# Patient Record
Sex: Female | Born: 1985 | Race: White | Hispanic: No | Smoking: Never smoker
Health system: Southern US, Community
[De-identification: ages and names within clinical notes are randomized; demographics above are authoritative.]

## PROBLEM LIST (undated history)

## (undated) ENCOUNTER — Inpatient Hospital Stay: Payer: Self-pay

## (undated) DIAGNOSIS — Z1589 Genetic susceptibility to other disease: Secondary | ICD-10-CM

## (undated) DIAGNOSIS — Z803 Family history of malignant neoplasm of breast: Secondary | ICD-10-CM

## (undated) DIAGNOSIS — Z9189 Other specified personal risk factors, not elsewhere classified: Secondary | ICD-10-CM

## (undated) HISTORY — DX: Genetic susceptibility to other disease: Z15.89

## (undated) HISTORY — DX: Other specified personal risk factors, not elsewhere classified: Z91.89

## (undated) HISTORY — DX: Family history of malignant neoplasm of breast: Z80.3

---
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
## 2005-09-09 ENCOUNTER — Emergency Department (HOSPITAL_COMMUNITY): Admission: EM | Admit: 2005-09-09 | Discharge: 2005-09-09 | Payer: Self-pay | Admitting: Emergency Medicine

## 2008-11-06 HISTORY — PX: ORIF ANKLE FRACTURE: SHX5408

## 2014-11-06 DIAGNOSIS — Z1589 Genetic susceptibility to other disease: Secondary | ICD-10-CM

## 2014-11-06 DIAGNOSIS — Z9189 Other specified personal risk factors, not elsewhere classified: Secondary | ICD-10-CM

## 2014-11-06 HISTORY — DX: Other specified personal risk factors, not elsewhere classified: Z91.89

## 2014-11-06 HISTORY — DX: Genetic susceptibility to other disease: Z15.89

## 2015-09-06 ENCOUNTER — Other Ambulatory Visit: Payer: Self-pay | Admitting: Obstetrics and Gynecology

## 2015-09-07 ENCOUNTER — Other Ambulatory Visit: Payer: Self-pay | Admitting: *Deleted

## 2015-09-07 ENCOUNTER — Other Ambulatory Visit: Payer: Self-pay | Admitting: Obstetrics and Gynecology

## 2015-09-07 ENCOUNTER — Inpatient Hospital Stay
Admission: RE | Admit: 2015-09-07 | Discharge: 2015-09-07 | Disposition: A | Discharge status: Home / Self Care | Payer: Self-pay | Source: Ambulatory Visit | Attending: *Deleted | Admitting: *Deleted

## 2015-09-07 DIAGNOSIS — Z9289 Personal history of other medical treatment: Secondary | ICD-10-CM

## 2015-09-07 DIAGNOSIS — R928 Other abnormal and inconclusive findings on diagnostic imaging of breast: Secondary | ICD-10-CM

## 2015-09-13 ENCOUNTER — Ambulatory Visit
Admission: RE | Admit: 2015-09-13 | Discharge: 2015-09-13 | Disposition: A | Discharge status: Home / Self Care | Payer: BLUE CROSS/BLUE SHIELD | Source: Ambulatory Visit | Attending: Obstetrics and Gynecology | Admitting: Obstetrics and Gynecology

## 2015-09-13 DIAGNOSIS — R928 Other abnormal and inconclusive findings on diagnostic imaging of breast: Secondary | ICD-10-CM | POA: Diagnosis present

## 2015-09-13 DIAGNOSIS — N63 Unspecified lump in breast: Secondary | ICD-10-CM | POA: Insufficient documentation

## 2015-09-22 ENCOUNTER — Ambulatory Visit: Payer: BLUE CROSS/BLUE SHIELD

## 2015-09-22 ENCOUNTER — Other Ambulatory Visit: Payer: BLUE CROSS/BLUE SHIELD

## 2015-11-07 NOTE — L&D Delivery Note (Signed)
Delivery Note At 6:11 AM a viable female was delivered via Vaginal, Spontaneous Delivery (Presentation:OA;LOA).  APGAR: 8, 9; weight 7 lb 13.6 oz (3560 g).   Placenta status: spontaneous,intact.  Cord:  with the following complications: none .  Cord pH: NA  Called to see patient.  Mom pushed to delivery viable female infant.  The head followed by shoulders, which delivered without difficulty, and the rest of the body.  No nuchal cord noted.  Baby to mom's chest.  Cord clamped and cut after > 5 min delay.  No cord blood obtained.  Placenta delivered spontaneously, intact, with a 3-vessel cord.  Second degree perineal laceration repaired with 3-0 Vicryl in standard fashion.  All counts correct.  Hemostasis obtained with IV pitocin and fundal massage. EBL 200 mL.    Anesthesia:  epidural Episiotomy: None Lacerations: 2nd degree Suture Repair: 3.0 vicryl Est. Blood Loss (mL):  Mom to postpartum.  Baby to Couplet care / Skin to Skin.  Tresea Mall, CNM

## 2016-01-30 ENCOUNTER — Encounter: Payer: Self-pay | Admitting: *Deleted

## 2016-01-30 ENCOUNTER — Observation Stay
Admission: EM | Admit: 2016-01-30 | Discharge: 2016-01-30 | Disposition: A | Discharge status: Home / Self Care | Payer: BLUE CROSS/BLUE SHIELD | Attending: Obstetrics and Gynecology | Admitting: Obstetrics and Gynecology

## 2016-01-30 DIAGNOSIS — Z3A20 20 weeks gestation of pregnancy: Secondary | ICD-10-CM | POA: Insufficient documentation

## 2016-01-30 DIAGNOSIS — O368120 Decreased fetal movements, second trimester, not applicable or unspecified: Principal | ICD-10-CM | POA: Insufficient documentation

## 2016-01-30 NOTE — OB Triage Note (Signed)
States no note of fetal movement since yesterday am. States she has noted fetal movement since 18-19 weeks, baby as reported to move  vigourously

## 2016-01-30 NOTE — OB Triage Note (Signed)
Pt instructed by Dr Jean RosenthalJackson to follow with next appointment and instructed to try Zantac 150 mg 1-2 daily as needed, may also try Prilosec 20 mg daily- with or without Zantac if needed. For her heartburn issues

## 2016-01-30 NOTE — Final Progress Note (Signed)
Physician Final Progress Note  Patient ID: Katelyn Russo MRN: [id number] DOB/AGE: 31/04/1986 30 y.o.  Admit date: 01/30/2016 Admitting provider: Conard NovakStephen D Arn Mcomber, MD Discharge date: 01/30/2016   Admission Diagnoses:  Supervision of pregnancy, second trimester 5658w5d gestation Decreased fetal movement  Discharge Diagnoses:  Supervision of pregnancy, second trimester 5858w5d gestation Decreased fetal movement - reassuring fetal heart rate with movement   History of Present Illness: The patient is a 30 y.o. female G3P1011 at 3358w5d who presents for decreased fetal movement. She has felt no fetal movement since yesterday at 2pm.  Denies ctx, lof, vb.   Past Medical History: No past medical history on file.  Past Surgical History  Procedure Laterality Date  . Rotator cuff repair Right 2004  . Orif ankle fracture Left 2010    No current facility-administered medications on file prior to encounter.   No current outpatient prescriptions on file prior to encounter.   Allergies: No Known Allergies  Social History   Social History  . Marital Status: Unknown    Spouse Name: N/A  . Number of Children: N/A  . Years of Education: N/A   Occupational History  . Not on file.   Social History Main Topics  . Smoking status: Not on file  . Smokeless tobacco: Not on file  . Alcohol Use: Not on file  . Drug Use: Not on file  . Sexual Activity: Not on file   Other Topics Concern  . Not on file   Social History Narrative    Physical Exam: BP 113/64 mmHg  Pulse 73  Temp(Src) 98.7 F (37.1 C) (Oral)  Resp 18  Ht 5\' 4"  (1.626 m)  Wt 62.285 kg (137 lb 5 oz)  BMI 23.56 kg/m2  SpO2 100%  LMP 09/12/2015  Gen: NAD CV: RRR Pulm: CTAB Pelvic: deferred Ext: no e/c/t  Consults: None  Significant Findings/ Diagnostic Studies:  Bedside ultrasound: single living intrauterine pregnancy with fetal movement. Fetal heart rate 155  Procedures: as above  Discharge Condition:  good  Disposition: Final discharge disposition not confirmed  Diet: Regular diet  Discharge Activity: Activity as tolerated     Medication List    TAKE these medications        ferrous sulfate 325 (65 FE) MG tablet  Take 325 mg by mouth daily with breakfast.     multivitamin tablet  Take 1 tablet by mouth daily.         Total time spent taking care of this patient: 25 minutes  Signed: Conard NovakJackson, Kirklin Mcduffee D, MD  01/30/2016, 8:23 PM

## 2016-01-30 NOTE — Progress Notes (Signed)
Pt left walking with Nursing Secretary to ER dept for d/c home, with belongings and d/c papers, (reviewed.)

## 2016-01-31 ENCOUNTER — Encounter (HOSPITAL_COMMUNITY): Payer: Self-pay

## 2016-02-29 ENCOUNTER — Encounter: Payer: Self-pay | Admitting: *Deleted

## 2016-02-29 ENCOUNTER — Observation Stay
Admission: EM | Admit: 2016-02-29 | Discharge: 2016-02-29 | Disposition: A | Discharge status: Home / Self Care | Payer: BLUE CROSS/BLUE SHIELD | Attending: Obstetrics and Gynecology | Admitting: Obstetrics and Gynecology

## 2016-02-29 ENCOUNTER — Observation Stay: Payer: BLUE CROSS/BLUE SHIELD

## 2016-02-29 DIAGNOSIS — K9289 Other specified diseases of the digestive system: Secondary | ICD-10-CM | POA: Diagnosis not present

## 2016-02-29 DIAGNOSIS — R1011 Right upper quadrant pain: Secondary | ICD-10-CM

## 2016-02-29 DIAGNOSIS — A084 Viral intestinal infection, unspecified: Secondary | ICD-10-CM | POA: Insufficient documentation

## 2016-02-29 DIAGNOSIS — O99282 Endocrine, nutritional and metabolic diseases complicating pregnancy, second trimester: Secondary | ICD-10-CM | POA: Diagnosis not present

## 2016-02-29 DIAGNOSIS — E860 Dehydration: Secondary | ICD-10-CM | POA: Diagnosis not present

## 2016-02-29 DIAGNOSIS — Z1501 Genetic susceptibility to malignant neoplasm of breast: Secondary | ICD-10-CM | POA: Insufficient documentation

## 2016-02-29 DIAGNOSIS — Z3A25 25 weeks gestation of pregnancy: Secondary | ICD-10-CM | POA: Insufficient documentation

## 2016-02-29 DIAGNOSIS — O99612 Diseases of the digestive system complicating pregnancy, second trimester: Secondary | ICD-10-CM | POA: Diagnosis not present

## 2016-02-29 DIAGNOSIS — M940 Chondrocostal junction syndrome [Tietze]: Secondary | ICD-10-CM | POA: Insufficient documentation

## 2016-02-29 DIAGNOSIS — O99512 Diseases of the respiratory system complicating pregnancy, second trimester: Secondary | ICD-10-CM | POA: Insufficient documentation

## 2016-02-29 DIAGNOSIS — Z1502 Genetic susceptibility to malignant neoplasm of ovary: Secondary | ICD-10-CM | POA: Diagnosis not present

## 2016-02-29 DIAGNOSIS — O9989 Other specified diseases and conditions complicating pregnancy, childbirth and the puerperium: Secondary | ICD-10-CM | POA: Diagnosis present

## 2016-02-29 LAB — COMPREHENSIVE METABOLIC PANEL
ALT: 12 U/L — AB (ref 14–54)
AST: 14 U/L — ABNORMAL LOW (ref 15–41)
Albumin: 3.7 g/dL (ref 3.5–5.0)
Alkaline Phosphatase: 40 U/L (ref 38–126)
Anion gap: 8 (ref 5–15)
BUN: 9 mg/dL (ref 6–20)
CHLORIDE: 105 mmol/L (ref 101–111)
CO2: 23 mmol/L (ref 22–32)
CREATININE: 0.5 mg/dL (ref 0.44–1.00)
Calcium: 9.1 mg/dL (ref 8.9–10.3)
GFR calc Af Amer: >60 mL/min (ref >60)
GFR calc non Af Amer: >60 mL/min (ref >60)
Glucose, Bld: 79 mg/dL (ref 65–99)
Potassium: 3.8 mmol/L (ref 3.5–5.1)
Sodium: 136 mmol/L (ref 135–145)
Total Bilirubin: 0.4 mg/dL (ref 0.3–1.2)
Total Protein: 6.5 g/dL (ref 6.5–8.1)

## 2016-02-29 LAB — CBC
HCT: 33.5 % — ABNORMAL LOW (ref 35.0–47.0)
Hemoglobin: 11.5 g/dL — ABNORMAL LOW (ref 12.0–16.0)
MCH: 31.1 pg (ref 26.0–34.0)
MCHC: 34.4 g/dL (ref 32.0–36.0)
MCV: 90.3 fL (ref 80.0–100.0)
Platelets: 348 10*3/uL (ref 150–440)
RBC: 3.71 MIL/uL — ABNORMAL LOW (ref 3.80–5.20)
RDW: 13.2 % (ref 11.5–14.5)
WBC: 9.5 10*3/uL (ref 3.6–11.0)

## 2016-02-29 LAB — LIPASE, BLOOD: Lipase: 25 U/L (ref 11–51)

## 2016-02-29 LAB — TROPONIN I: Troponin I: <0.03 ng/mL (ref <0.031)

## 2016-02-29 LAB — CKMB (ARMC ONLY): CK, MB: 0.8 ng/mL (ref 0.5–5.0)

## 2016-02-29 MED ORDER — ONDANSETRON HCL 4 MG/2ML IJ SOLN
4.0000 mg | Freq: Four times a day (QID) | INTRAMUSCULAR | Status: DC | PRN
  Administered 2016-02-29: 4 mg via INTRAVENOUS
  Filled 2016-02-29: qty 2

## 2016-02-29 MED ORDER — LACTATED RINGERS IV SOLN
INTRAVENOUS | Status: DC
  Administered 2016-02-29: 17:00:00 via INTRAVENOUS

## 2016-02-29 NOTE — H&P (Signed)
Obstetric H&P   Chief Complaint: Nausea, vomiting, chest pain  Prenatal Care Provider: [facility]  History of Present Illness: 30 y.o. G3P1011 40w0dby 06/13/2016, by LMP=8wk [place] L&[place] with 24-hrs of nausea, vomiting, loose stools.  Emesis improved but continued po intolerance.  No fevers, chills, or sick contacts.  Ate grilled cheese yesterday evening after which she had onset of symptoms.  She had similar symptoms and pain off and on since her last pregnancy and was told she possibly had gallstones.   She had associated epigastric and and back pain.  No diaphoresis, shortness of breath.  No known cardiac history, personal or family.    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   A pos / ABSC neg / RI / VZI / RPR NR / HIV neg / HBsAg neg   Review of Systems: 10 point review of systems negative unless otherwise noted in HPI  Past Medical History: No past medical history on file.  Past Surgical History: Past Surgical History  Procedure Laterality Date  . Rotator cuff repair Right 2004  . Orif ankle fracture Left 2010    Family History: Family History  Problem Relation Age of Onset  . Breast cancer Mother 463 . Breast cancer Maternal Grandmother 65    again at 767    Social History: Social History   Social History  . Marital Status: Unknown    Spouse Name: N/A  . Number of Children: N/A  . Years of Education: N/A   Occupational History  . Not on file.   Social History Main Topics  . Smoking status: Not on file  . Smokeless tobacco: Not on file  . Alcohol Use: Not on file  . Drug Use: Not on file  . Sexual Activity: Not on file   Other Topics Concern  . Not on file   Social History Narrative    Medications: Prior to Admission medications   Medication Sig Start Date End Date Taking? Authorizing Provider  ferrous sulfate 325 (65 FE) MG tablet Take 325 mg by mouth daily with breakfast.    Historical  Provider, MD  Multiple Vitamin (MULTIVITAMIN) tablet Take 1 tablet by mouth daily.    Historical Provider, MD    Allergies: No Known Allergies  Physical Exam: Vitals: Last menstrual period 09/12/2015.  Urine Dip Protein:  UA pending  FHT: 150, moderate variability, 10x10 accels, no decels Toco: none  General: NAD HEENT: normocephalic, anicteric Pulmonary: no increased work of breathing Cardiovascular: RRR Abdomen: Gravid,  Non-tender Genitourinary: deferred MSK: no CVA tenderness Extremities: no edema  Labs: No results found for this or any previous visit (from the past 24 hour(s)).  Assessment: 31y.o. G3P1011 256w0dy 06/13/2016,by LMP=8wk [place] with nausea, vomiting, and chest pain  Plan: 1) Nausea vomiting - will check CMP, CBC, lipase, and UA.  DDx includes gastroenteritis, biliary colic/cholelithiasis, and pancreatitis. - If consider RUQ ultrasound once labs reviewed   2) Fetus - cat I tracing  3)  Chest pain - doubt cardiac etiology but will check EKG and one set of cardiac enzymes  4) PNL A pos / ABSC neg / RI / VZI / RPR NR / HIV neg / HBsAg neg   5) Disposition - pending results of labs

## 2016-02-29 NOTE — Final Progress Note (Signed)
Physician Final Progress Note  Patient ID: Katelyn Russo MRN: [id number] DOB/AGE: 31/04/1986 31 y.o.  Admit date: 02/29/2016 Admitting provider: Vena AustriaAndreas Maha Fischel, MD Discharge date: 02/29/2016   Admission Diagnoses: Nausea, vomiting chest pain  Discharge Diagnoses:  Active Problems:   Dehydration Viral gastroenteritis, Tietze syndrome  Consults: None  Significant Findings/ Diagnostic Studies:  Results for orders placed or performed during the hospital encounter of 02/29/16 (from the past 24 hour(s))  CBC     Status: Abnormal   Collection Time: 02/29/16  3:58 PM  Result Value Ref Range   WBC 9.5 3.6 - 11.0 K/uL   RBC 3.71 (L) 3.80 - 5.20 MIL/uL   Hemoglobin 11.5 (L) 12.0 - 16.0 g/dL   HCT 82.933.5 (L) 56.235.0 - 13.047.0 %   MCV 90.3 80.0 - 100.0 fL   MCH 31.1 26.0 - 34.0 pg   MCHC 34.4 32.0 - 36.0 g/dL   RDW 86.513.2 78.411.5 - 69.614.5 %   Platelets 348 150 - 440 K/uL  Comprehensive metabolic panel     Status: Abnormal   Collection Time: 02/29/16  3:58 PM  Result Value Ref Range   Sodium 136 135 - 145 mmol/L   Potassium 3.8 3.5 - 5.1 mmol/L   Chloride 105 101 - 111 mmol/L   CO2 23 22 - 32 mmol/L   Glucose, Bld 79 65 - 99 mg/dL   BUN 9 6 - 20 mg/dL   Creatinine, Ser 2.950.50 0.44 - 1.00 mg/dL   Calcium 9.1 8.9 - 28.410.3 mg/dL   Total Protein 6.5 6.5 - 8.1 g/dL   Albumin 3.7 3.5 - 5.0 g/dL   AST 14 (L) 15 - 41 U/L   ALT 12 (L) 14 - 54 U/L   Alkaline Phosphatase 40 38 - 126 U/L   Total Bilirubin 0.4 0.3 - 1.2 mg/dL   GFR calc non Af Amer >60 >60 mL/min   GFR calc Af Amer >60 >60 mL/min   Anion gap 8 5 - 15  Lipase, blood     Status: None   Collection Time: 02/29/16  3:58 PM  Result Value Ref Range   Lipase 25 11 - 51 U/L  CKMB([facility] only)     Status: None   Collection Time: 02/29/16  3:58 PM  Result Value Ref Range   CK, MB 0.8 0.5 - 5.0 ng/mL  Troponin I     Status: None   Collection Time: 02/29/16  3:58 PM  Result Value Ref Range   Troponin I <0.03 <0.031 ng/mL   [place] Abdomen  Limited Ruq  02/29/2016  CLINICAL DATA:  Right upper quadrant pain EXAM: US ABDOMEN LIMITED - RIGHT UPPER QUADRANT COMPARISON:  None. FINDINGS: Gallbladder: Somewhat decompressed. No definitive gallstones are seen. There is suggestion of a 3 mm gallbladder polyp. No pericholecystic fluid is noted. Common bile duct: Diameter: 2.7 mm. Liver: No focal lesion identified. Within normal limits in parenchymal echogenicity. IMPRESSION: No acute abnormality noted. The gallbladder is predominately decompressed with a small gallbladder polyp. Electronically Signed   By: Alcide CleverMark  Lukens M.D.   On: 02/29/2016 18:18   Procedures:  1) Reactive NST 2) Normal EKG 3) Right upper quadrant ultrasound  Discharge Condition: good  Disposition: 01-Home or Self Care  Diet: Regular diet  Discharge Activity: Activity as tolerated     Medication List    TAKE these medications        ferrous sulfate 325 (65 FE) MG tablet  Take 325 mg by mouth daily with breakfast.  multivitamin tablet  Take 1 tablet by mouth daily.         Total time spent taking care of this patient: 45 minutes  Signed: Lorrene Reid 02/29/2016, 8:19 PM

## 2016-02-29 NOTE — Discharge Instructions (Signed)
Please get plenty of rest and water.  Please call or return if symptoms worsen. Discharge instructions given. Patient stated understanding.

## 2016-05-27 ENCOUNTER — Inpatient Hospital Stay: Payer: BLUE CROSS/BLUE SHIELD | Admitting: Anesthesiology

## 2016-05-27 ENCOUNTER — Encounter: Payer: Self-pay | Admitting: *Deleted

## 2016-05-27 ENCOUNTER — Inpatient Hospital Stay
Admission: EM | Admit: 2016-05-27 | Discharge: 2016-05-29 | DRG: 775 | Disposition: A | Discharge status: Home / Self Care | Payer: BLUE CROSS/BLUE SHIELD | Attending: Obstetrics & Gynecology | Admitting: Obstetrics & Gynecology

## 2016-05-27 DIAGNOSIS — Z9889 Other specified postprocedural states: Secondary | ICD-10-CM | POA: Diagnosis not present

## 2016-05-27 DIAGNOSIS — Z803 Family history of malignant neoplasm of breast: Secondary | ICD-10-CM

## 2016-05-27 DIAGNOSIS — Z79899 Other long term (current) drug therapy: Secondary | ICD-10-CM

## 2016-05-27 DIAGNOSIS — Z3A37 37 weeks gestation of pregnancy: Secondary | ICD-10-CM | POA: Diagnosis not present

## 2016-05-27 DIAGNOSIS — Z885 Allergy status to narcotic agent status: Secondary | ICD-10-CM | POA: Diagnosis not present

## 2016-05-27 LAB — CBC
HEMATOCRIT: 35 % (ref 35.0–47.0)
HEMOGLOBIN: 12.3 g/dL (ref 12.0–16.0)
MCH: 31.2 pg (ref 26.0–34.0)
MCHC: 35.1 g/dL (ref 32.0–36.0)
MCV: 88.9 fL (ref 80.0–100.0)
Platelets: 287 10*3/uL (ref 150–440)
RBC: 3.94 MIL/uL (ref 3.80–5.20)
RDW: 13.2 % (ref 11.5–14.5)
WBC: 11.1 10*3/uL — AB (ref 3.6–11.0)

## 2016-05-27 LAB — TYPE AND SCREEN
ABO/RH(D): A POS
ANTIBODY SCREEN: NEGATIVE

## 2016-05-27 MED ORDER — LIDOCAINE HCL (PF) 1 % IJ SOLN: 30.0000 mL | INTRAMUSCULAR | Status: DC | PRN

## 2016-05-27 MED ORDER — LACTATED RINGERS IV SOLN
INTRAVENOUS | Status: DC
  Administered 2016-05-27: 21:00:00 via INTRAVENOUS

## 2016-05-27 MED ORDER — BUTORPHANOL TARTRATE 1 MG/ML IJ SOLN: 1.0000 mg | INTRAMUSCULAR | Status: DC | PRN

## 2016-05-27 MED ORDER — OXYTOCIN 40 UNITS IN LACTATED RINGERS INFUSION - SIMPLE MED
2.5000 [IU]/h | INTRAVENOUS | Status: DC
  Administered 2016-05-28: 39.96 [IU]/h via INTRAVENOUS
  Filled 2016-05-27: qty 1000

## 2016-05-27 MED ORDER — OXYTOCIN BOLUS FROM INFUSION: 500.0000 mL | INTRAVENOUS | Status: DC

## 2016-05-27 MED ORDER — FENTANYL 2.5 MCG/ML W/ROPIVACAINE 0.2% IN NS 100 ML EPIDURAL INFUSION (ARMC-ANES)
EPIDURAL | Status: AC
  Administered 2016-05-27: 10 mL/h via EPIDURAL
  Filled 2016-05-27: qty 100

## 2016-05-27 MED ORDER — AMMONIA AROMATIC IN INHA
RESPIRATORY_TRACT | Status: AC
  Filled 2016-05-27: qty 10

## 2016-05-27 MED ORDER — OXYTOCIN 10 UNIT/ML IJ SOLN
INTRAMUSCULAR | Status: AC
  Filled 2016-05-27: qty 2

## 2016-05-27 MED ORDER — BUPIVACAINE HCL (PF) 0.25 % IJ SOLN
INTRAMUSCULAR | Status: DC | PRN
  Administered 2016-05-27: 3 mL via EPIDURAL

## 2016-05-27 MED ORDER — ACETAMINOPHEN 325 MG PO TABS: 650.0000 mg | ORAL_TABLET | ORAL | Status: DC | PRN

## 2016-05-27 MED ORDER — MISOPROSTOL 200 MCG PO TABS
ORAL_TABLET | ORAL | Status: AC
  Filled 2016-05-27: qty 4

## 2016-05-27 MED ORDER — LIDOCAINE HCL (PF) 1 % IJ SOLN
INTRAMUSCULAR | Status: AC
  Filled 2016-05-27: qty 30

## 2016-05-27 MED ORDER — ONDANSETRON HCL 4 MG/2ML IJ SOLN
4.0000 mg | Freq: Four times a day (QID) | INTRAMUSCULAR | Status: DC | PRN
  Administered 2016-05-28: 4 mg via INTRAVENOUS
  Filled 2016-05-27: qty 2

## 2016-05-27 MED ORDER — LACTATED RINGERS IV SOLN: 500.0000 mL | INTRAVENOUS | Status: DC | PRN

## 2016-05-27 NOTE — OB Triage Note (Signed)
Pt complains of contractions that started around3pm today.  Pt rates contractions 7-8/10. Diarrhea reported x2 over last two days.

## 2016-05-27 NOTE — Anesthesia Procedure Notes (Signed)
Epidural Patient location during procedure: OB  Staffing Anesthesiologist: Berdine Addison Performed by: anesthesiologist   Preanesthetic Checklist Completed: patient identified, site marked, surgical consent, pre-op evaluation, timeout performed, IV checked, risks and benefits discussed and monitors and equipment checked  Epidural Patient position: sitting Prep: Betadine Patient monitoring: heart rate, continuous pulse ox and blood pressure Approach: midline Location: L4-L5 Injection technique: LOR saline  Needle:  Needle type: Tuohy  Needle gauge: 18 G Needle length: 9 cm and 9 Catheter type: closed end flexible Catheter size: 20 Guage Test dose: negative and 1.5% lidocaine with Epi 1:200 K  Assessment Sensory level: T10 Events: blood not aspirated, injection not painful, no injection resistance, negative IV test and no paresthesia  Additional Notes   Patient tolerated the insertion well without complications. 2145 start. 2153 catheter. 2155 test. 2200 bolus 3 ml. 2204 Infusion.Reason for block:procedure for pain

## 2016-05-27 NOTE — Anesthesia Preprocedure Evaluation (Signed)
Anesthesia Evaluation  Patient identified by MRN, date of birth, ID band Patient awake    Reviewed: Allergy & Precautions, NPO status , Patient's Chart, lab work & pertinent test results, reviewed documented beta blocker date and time   Airway Mallampati: II  TM Distance: >3 FB     Dental  (+) Chipped   Pulmonary           Cardiovascular      Neuro/Psych    GI/Hepatic   Endo/Other    Renal/GU      Musculoskeletal   Abdominal   Peds  Hematology   Anesthesia Other Findings   Reproductive/Obstetrics                             Anesthesia Physical Anesthesia Plan  ASA: II  Anesthesia Plan: Epidural   Post-op Pain Management:    Induction:   Airway Management Planned:   Additional Equipment:   Intra-op Plan:   Post-operative Plan:   Informed Consent: I have reviewed the patients History and Physical, chart, labs and discussed the procedure including the risks, benefits and alternatives for the proposed anesthesia with the patient or authorized representative who has indicated his/her understanding and acceptance.     Plan Discussed with: CRNA  Anesthesia Plan Comments:         Anesthesia Quick Evaluation  

## 2016-05-27 NOTE — H&P (Signed)
OB History & Physical   History of Present Illness:  Chief Complaint: Contractions  Date of admission: 05/27/2016  HPI:  Katelyn Russo is a 30 y.o. G12P1011 female at [redacted]w[redacted]d per prenatal records dated by LMP agrees with 8wk6dU/S 1/3. She has an EDD of 8/13 per prenatal records. In Epic her due date is entered as 26/8 and a gestational age as [redacted]w[redacted]d on admission 7/22.  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    She reports contractions.   She denies leakage of fluid.   She denies vaginal bleeding.   She reports fetal movement.    Maternal Medical History:  History reviewed. No pertinent past medical history.  Past Surgical History  Procedure Laterality Date  . Rotator cuff repair Right 2004  . Orif ankle fracture Left 2010    Allergies  Allergen Reactions  . Morphine And Related Other (See Comments)    "felt like my body was on fire"    Prior to Admission medications   Medication Sig Start Date End Date Taking? Authorizing Provider  Multiple Vitamin (MULTIVITAMIN) tablet Take 1 tablet by mouth daily.   Yes Historical Provider, MD  ferrous sulfate 325 (65 FE) MG tablet Take 325 mg by mouth daily with breakfast. Reported on 05/27/2016    Historical Provider, MD  omeprazole (PRILOSEC) 20 MG capsule Take 20 mg by mouth daily.    Historical Provider, MD    OB History  Gravida Para Term Preterm AB SAB TAB Ectopic Multiple Living  # Outcome Date GA Lbr Len/2nd Weight Sex Delivery Anes PTL Lv  3 Current           2 SAB           1 Term               Prenatal care site: [facility]  Social History: She  reports that she has never smoked. She has never used smokeless tobacco. She reports that she does not drink alcohol or use illicit drugs.  Family History: family history includes Breast cancer (age of onset: 2) in her mother; Breast cancer (age of onset: 25) in  her maternal grandmother.   Review of Systems: Negative x 10 systems reviewed except as noted in the HPI.    Physical Exam:  Vital Signs: BP 100/63 mmHg  Pulse 73  LMP 09/12/2015 General: no acute distress.  HEENT: normocephalic, atraumatic Heart: regular rate & rhythm.  No murmurs/rubs/gallops Lungs: clear to auscultation bilaterally Abdomen: soft, gravid, non-tender;  EFW: 8 pounds Pelvic: (female chaperone present during pelvic exam)  External: Normal external female genitalia  Cervix: Dilation: 5.5 / Effacement (%): 80 / Station: -1   Extremities: non-tender, symmetric, no edema bilaterally.  DTRs: 2+  Neurologic: Alert & oriented x 3.    Pertinent Results:  Prenatal Labs: Blood type/Rh A positive  Antibody screen negative  Rubella Immune  Varicella Immune    RPR negative  HBsAg negative  HIV negative  GC negative  Chlamydia negative  Genetic screening Not done  1 hour GTT 123 on 5/22  3 hour GTT NA  GBS Unknown, specimen collected 7/21   Baseline FHR: 140 beats/min   Variability: moderate   Accelerations: present   Decelerations: absent Contractions: present frequency: 2-5 min Overall assessment: Category I tracing   Assessment:  Katelyn Russo is a 30 y.o. Q6V7846  female at [redacted]w[redacted]d with labor contractions.   Plan:  1. Admit to Labor & Delivery  2. CBC, T&S, Clrs, IVF 3. GBS unknown: membranes intact, no risk factors, no treatment per CDC guidelines.   4. Fetal well-being: Category I 5. Epidural as desired  Emalea Mix, CNM

## 2016-05-28 MED ORDER — DIPHENHYDRAMINE HCL 25 MG PO CAPS: 25.0000 mg | ORAL_CAPSULE | Freq: Four times a day (QID) | ORAL | Status: DC | PRN

## 2016-05-28 MED ORDER — SIMETHICONE 80 MG PO CHEW: 80.0000 mg | CHEWABLE_TABLET | ORAL | Status: DC | PRN

## 2016-05-28 MED ORDER — COCONUT OIL OIL
1.0000 "application " | TOPICAL_OIL | Status: DC | PRN
  Administered 2016-05-28: 1 via TOPICAL
  Filled 2016-05-28: qty 120

## 2016-05-28 MED ORDER — WITCH HAZEL-GLYCERIN EX PADS: 1.0000 "application " | MEDICATED_PAD | CUTANEOUS | Status: DC | PRN

## 2016-05-28 MED ORDER — BENZOCAINE-MENTHOL 20-0.5 % EX AERO
1.0000 "application " | INHALATION_SPRAY | CUTANEOUS | Status: DC | PRN
  Filled 2016-05-28: qty 56

## 2016-05-28 MED ORDER — FENTANYL 2.5 MCG/ML W/ROPIVACAINE 0.2% IN NS 100 ML EPIDURAL INFUSION (ARMC-ANES): 10.0000 mL/h | EPIDURAL | Status: DC

## 2016-05-28 MED ORDER — SENNOSIDES-DOCUSATE SODIUM 8.6-50 MG PO TABS
2.0000 | ORAL_TABLET | ORAL | Status: DC
  Administered 2016-05-28: 2 via ORAL
  Filled 2016-05-28: qty 2

## 2016-05-28 MED ORDER — EPHEDRINE 5 MG/ML INJ: 10.0000 mg | INTRAVENOUS | Status: DC | PRN

## 2016-05-28 MED ORDER — PRENATAL MULTIVITAMIN CH: 1.0000 | ORAL_TABLET | Freq: Every day | ORAL | Status: DC

## 2016-05-28 MED ORDER — SODIUM CHLORIDE FLUSH 0.9 % IV SOLN
INTRAVENOUS | Status: AC
  Filled 2016-05-28: qty 10

## 2016-05-28 MED ORDER — FENTANYL CITRATE (PF) 100 MCG/2ML IJ SOLN
INTRAMUSCULAR | Status: AC
  Administered 2016-05-28: 50 ug via INTRAVENOUS
  Filled 2016-05-28: qty 2

## 2016-05-28 MED ORDER — ONDANSETRON HCL 4 MG PO TABS
4.0000 mg | ORAL_TABLET | ORAL | Status: DC | PRN
  Administered 2016-05-29: 4 mg via ORAL
  Filled 2016-05-28: qty 1

## 2016-05-28 MED ORDER — PHENYLEPHRINE 40 MCG/ML (10ML) SYRINGE FOR IV PUSH (FOR BLOOD PRESSURE SUPPORT): 80.0000 ug | PREFILLED_SYRINGE | INTRAVENOUS | Status: DC | PRN

## 2016-05-28 MED ORDER — SENNOSIDES-DOCUSATE SODIUM 8.6-50 MG PO TABS: 2.0000 | ORAL_TABLET | ORAL | Status: DC

## 2016-05-28 MED ORDER — OXYCODONE-ACETAMINOPHEN 5-325 MG PO TABS
1.0000 | ORAL_TABLET | ORAL | Status: DC | PRN
  Administered 2016-05-28 – 2016-05-29 (×2): 1 via ORAL
  Filled 2016-05-28: qty 2
  Filled 2016-05-28: qty 1

## 2016-05-28 MED ORDER — LACTATED RINGERS IV SOLN: 500.0000 mL | Freq: Once | INTRAVENOUS | Status: DC

## 2016-05-28 MED ORDER — ACETAMINOPHEN 325 MG PO TABS: 650.0000 mg | ORAL_TABLET | ORAL | Status: DC | PRN

## 2016-05-28 MED ORDER — DIBUCAINE 1 % RE OINT: 1.0000 "application " | TOPICAL_OINTMENT | RECTAL | Status: DC | PRN

## 2016-05-28 MED ORDER — IBUPROFEN 600 MG PO TABS
600.0000 mg | ORAL_TABLET | Freq: Four times a day (QID) | ORAL | Status: DC
  Administered 2016-05-28 – 2016-05-29 (×4): 600 mg via ORAL
  Filled 2016-05-28 (×4): qty 1

## 2016-05-28 MED ORDER — FENTANYL CITRATE (PF) 100 MCG/2ML IJ SOLN
50.0000 ug | Freq: Once | INTRAMUSCULAR | Status: AC
  Administered 2016-05-28: 50 ug via INTRAVENOUS

## 2016-05-28 MED ORDER — EPHEDRINE 5 MG/ML INJ
10.0000 mg | INTRAVENOUS | Status: DC | PRN
Start: 2016-05-28 — End: 2016-05-28

## 2016-05-28 MED ORDER — ONDANSETRON HCL 4 MG/2ML IJ SOLN: 4.0000 mg | INTRAMUSCULAR | Status: DC | PRN

## 2016-05-28 NOTE — Discharge Summary (Signed)
Obstetric Discharge Summary   Reason for Admission: onset of labor Prenatal Procedures: ultrasound Intrapartum Procedures: spontaneous vaginal delivery, 05/28/16 Postpartum Procedures: none Complications-Operative and Postpartum: 2nd degree perineal laceration   Hemoglobin  Date Value Ref Range Status  05/29/2016 9.7 (L) 12.0 - 16.0 g/dL Final   HCT  Date Value Ref Range Status  05/29/2016 28.0 (L) 35.0 - 47.0 % Final   A+, Rubella Immune, Varicella Immune, TDAP UTD Breastfeeding/Contraception: Breastfeeding, undecided re: contraception  Patient is ambulating and voiding without difficulty. She is tolerating PO intake and her pain is well controlled with PO pain meds.  Physical Exam:  BP 97/63 (BP Location: Left Arm)   Pulse 78   Temp 98.4 F (36.9 C) (Oral)   Resp 18   LMP 09/12/2015   SpO2 100%   Breastfeeding? Unknown   General: alert, cooperative, appears stated age and no distress Lochia: appropriate Uterine Fundus: firm Incision: NA DVT Evaluation: No evidence of DVT seen on physical exam.  Follow-up Information    GLEDHILL,JANE, CNM. Call in 6 week(s).   Specialty:  Obstetrics Contact information: [address] [place] [address] [phone]            Medication List    TAKE these medications   ibuprofen 600 MG tablet Commonly known as:  ADVIL,MOTRIN Take 1 tablet (600 mg total) by mouth every 6 (six) hours.   multivitamin tablet Take 1 tablet by mouth daily.   omeprazole 20 MG capsule Commonly known as:  PRILOSEC Take 20 mg by mouth daily.     ASK your doctor about these medications   ferrous sulfate 325 (65 FE) MG tablet Take 325 mg by mouth daily with breakfast. Reported on 05/27/2016       Discharge Diagnoses: Term Pregnancy-delivered  Discharge Information: Date: 05/29/2016 Activity: pelvic rest Diet: routine Condition: stable Instructions: Postpartum care for vaginal delivery instructions reviewed with patient. Written  materials given to patient. Discharge to: home  Newborn Data: Live born female Sharlet [place] Birth Weight: 7 lb 13.6 oz (3560 g) APGAR: 8, 9  Home with mother.  Conard Novak, MD 05/29/2016, 10:41 AM

## 2016-05-29 ENCOUNTER — Encounter: Payer: Self-pay | Admitting: Anesthesiology

## 2016-05-29 LAB — RPR: RPR Ser Ql: NONREACTIVE

## 2016-05-29 LAB — CBC
HEMATOCRIT: 28 % — AB (ref 35.0–47.0)
HEMOGLOBIN: 9.7 g/dL — AB (ref 12.0–16.0)
MCH: 31.1 pg (ref 26.0–34.0)
MCHC: 34.6 g/dL (ref 32.0–36.0)
MCV: 90 fL (ref 80.0–100.0)
Platelets: 231 10*3/uL (ref 150–440)
RBC: 3.12 MIL/uL — ABNORMAL LOW (ref 3.80–5.20)
RDW: 13.1 % (ref 11.5–14.5)
WBC: 12.4 10*3/uL — AB (ref 3.6–11.0)

## 2016-05-29 MED ORDER — IBUPROFEN 600 MG PO TABS: 600.0000 mg | ORAL_TABLET | Freq: Four times a day (QID) | ORAL | 0 refills | Status: DC

## 2016-05-29 NOTE — Discharge Instructions (Signed)
Follow up sooner with fever, problems breathing, pain not helped by medications, severe depression( more than just baby blues, wanting to hurt yourself or the baby), severe bleeding ( saturating more than one pad an hour or large palm sized clots), no heavy lifting , no driving while taking narcotics, no douches, intercourse, tampons or enemas for 6 weeks  °

## 2016-05-29 NOTE — Progress Notes (Signed)
All discharge instructions given to patient and she voices understanding of all instructions given. She will make her own f/u appt for 6 wks. Prescription given.  Patient discharged home with spouse and infant escorted out by auxillary .

## 2016-05-29 NOTE — Addendum Note (Signed)
`<  DIV class=debugReport><DIV class=debugTitle>Report [id number] - AN AUDIT TRAIL REPORT</DIV></DIV>`<DIV class=debugTitle>Print Group [id number] - An Audit Trail With Extended Information</DIV>Addendum  created 05/29/16 1030 by Fabienne Bruns, RN   Anesthesia Procedure Navigator section edited

## 2016-06-06 ENCOUNTER — Encounter: Payer: Self-pay | Admitting: Anesthesiology

## 2017-01-03 ENCOUNTER — Other Ambulatory Visit: Payer: Self-pay | Admitting: Obstetrics and Gynecology

## 2017-01-03 DIAGNOSIS — Z1231 Encounter for screening mammogram for malignant neoplasm of breast: Secondary | ICD-10-CM

## 2017-01-08 ENCOUNTER — Encounter: Payer: Self-pay | Admitting: Radiology

## 2017-01-08 ENCOUNTER — Ambulatory Visit
Admission: RE | Admit: 2017-01-08 | Discharge: 2017-01-08 | Disposition: A | Discharge status: Home / Self Care | Payer: BLUE CROSS/BLUE SHIELD | Source: Ambulatory Visit | Attending: Obstetrics and Gynecology | Admitting: Obstetrics and Gynecology

## 2017-01-08 ENCOUNTER — Telehealth: Payer: Self-pay

## 2017-01-08 DIAGNOSIS — Z1231 Encounter for screening mammogram for malignant neoplasm of breast: Secondary | ICD-10-CM

## 2017-01-08 DIAGNOSIS — Z803 Family history of malignant neoplasm of breast: Secondary | ICD-10-CM | POA: Diagnosis not present

## 2017-01-08 NOTE — Telephone Encounter (Signed)
CLG patient

## 2017-01-08 NOTE — Telephone Encounter (Signed)
Pt calling to see if we recv'd [place].  Pt would like to get results today. [phone]

## 2017-01-09 ENCOUNTER — Other Ambulatory Visit: Payer: Self-pay | Admitting: Obstetrics and Gynecology

## 2017-01-09 DIAGNOSIS — Z9189 Other specified personal risk factors, not elsewhere classified: Secondary | ICD-10-CM

## 2017-01-09 DIAGNOSIS — Z1589 Genetic susceptibility to other disease: Secondary | ICD-10-CM

## 2017-01-09 NOTE — Telephone Encounter (Signed)
Report received and sent to [facility]. Please review and contact pt with results. Thank you.

## 2017-01-09 NOTE — Telephone Encounter (Signed)
Advised pt CLG would contact once we received results. Pt aware CLG out of the office until Thurs 3/8.

## 2017-01-09 NOTE — Telephone Encounter (Signed)
3/6 Left message that mammogram was normal. Katelyn Russo

## 2017-01-19 ENCOUNTER — Ambulatory Visit: Payer: BLUE CROSS/BLUE SHIELD | Admitting: Oncology

## 2017-02-04 DIAGNOSIS — Z1379 Encounter for other screening for genetic and chromosomal anomalies: Secondary | ICD-10-CM | POA: Insufficient documentation

## 2017-02-04 NOTE — Progress Notes (Deleted)
[name]  Telephone:([phone] Fax:(336) [phone]  ID: Katelyn Russo OB: 08/09/1986  MR#: [id number]  AOZ#:[id number]  Patient Care Team: No Pcp Per Patient as PCP - General (General Practice)  CHIEF COMPLAINT: Genetic testing.  INTERVAL HISTORY: ***  REVIEW OF SYSTEMS:   ROS  As per HPI. Otherwise, a complete review of systems is negative.  PAST MEDICAL HISTORY: No past medical history on file.  PAST SURGICAL HISTORY: Past Surgical History:  Procedure Laterality Date  . ORIF ANKLE FRACTURE Left 2010  . ROTATOR CUFF REPAIR Right 2004    FAMILY HISTORY: Family History  Problem Relation Age of Onset  . Breast cancer Mother 54  . Breast cancer Maternal Grandmother 44    again at 53     ADVANCED DIRECTIVES (Y/N):  N  HEALTH MAINTENANCE: Social History  Substance Use Topics  . Smoking status: Never Smoker  . Smokeless tobacco: Never Used  . Alcohol use No     Colonoscopy:  PAP:  Bone density:  Lipid panel:  Allergies  Allergen Reactions  . Morphine And Related Other (See Comments)    "felt like my body was on fire"    Current Outpatient Prescriptions  Medication Sig Dispense Refill  . ibuprofen (ADVIL,MOTRIN) 600 MG tablet Take 1 tablet (600 mg total) by mouth every 6 (six) hours. 30 tablet 0  . Multiple Vitamin (MULTIVITAMIN) tablet Take 1 tablet by mouth daily.    Marland Kitchen omeprazole (PRILOSEC) 20 MG capsule Take 20 mg by mouth daily.     No current facility-administered medications for this visit.     OBJECTIVE: There were no vitals filed for this visit.   There is no height or weight on file to calculate BMI.    ECOG FS:{CHL ONC [id number]  General: Well-developed, well-nourished, no acute distress. Eyes: Pink conjunctiva, anicteric sclera. HEENT: Normocephalic, moist mucous membranes, clear oropharnyx. Lungs: Clear to auscultation bilaterally. Heart: Regular rate and rhythm. No rubs, murmurs, or gallops. Abdomen: Soft,  nontender, nondistended. No organomegaly noted, normoactive bowel sounds. Musculoskeletal: No edema, cyanosis, or clubbing. Neuro: Alert, answering all questions appropriately. Cranial nerves grossly intact. Skin: No rashes or petechiae noted. Psych: Normal affect. Lymphatics: No cervical, calvicular, axillary or inguinal LAD.   LAB RESULTS:  Lab Results  Component Value Date   NA 136 02/29/2016   K 3.8 02/29/2016   CL 105 02/29/2016   CO2 23 02/29/2016   GLUCOSE 79 02/29/2016   BUN 9 02/29/2016   CREATININE 0.50 02/29/2016   CALCIUM 9.1 02/29/2016   PROT 6.5 02/29/2016   ALBUMIN 3.7 02/29/2016   AST 14 (L) 02/29/2016   ALT 12 (L) 02/29/2016   ALKPHOS 40 02/29/2016   BILITOT 0.4 02/29/2016   GFRNONAA >60 02/29/2016   GFRAA >60 02/29/2016    Lab Results  Component Value Date   WBC 12.4 (H) 05/29/2016   HGB 9.7 (L) 05/29/2016   HCT 28.0 (L) 05/29/2016   MCV 90.0 05/29/2016   PLT 231 05/29/2016     STUDIES: Mm Screening Breast Tomo Bilateral  Result Date: 01/08/2017 CLINICAL DATA:  Screening. Strong family history of breast cancer. The patient's mother was diagnosed with breast cancer at the age of 98. EXAM: 2D DIGITAL SCREENING BILATERAL MAMMOGRAM WITH CAD AND ADJUNCT TOMO COMPARISON:  None. ACR Breast Density Category d: The breast tissue is extremely dense, which lowers the sensitivity of mammography. FINDINGS: There are no findings suspicious for malignancy. Images were processed with CAD. IMPRESSION: No mammographic evidence of malignancy. A  result letter of this screening mammogram will be mailed directly to the patient. RECOMMENDATION: Screening mammogram in 1 year is recommended. Patient has a strong family history of breast cancer with her mother being diagnosed at the age of 23. BI-RADS CATEGORY  1: Negative. Electronically Signed   By: Baird Lyons M.D.   On: 01/08/2017 12:45    ASSESSMENT: Genetic testing  PLAN:    1. Genetic testing:  Patient expressed  understanding and was in agreement with this plan. She also understands that She can call clinic at any time with any questions, concerns, or complaints.   Cancer Staging No matching staging information was found for the patient.  Katelyn Ruths, MD   02/04/2017 10:42 PM

## 2017-02-05 ENCOUNTER — Inpatient Hospital Stay: Payer: BLUE CROSS/BLUE SHIELD | Admitting: Oncology

## 2017-04-20 ENCOUNTER — Encounter: Payer: Self-pay | Admitting: Obstetrics and Gynecology

## 2017-11-21 ENCOUNTER — Encounter: Payer: Self-pay | Admitting: Obstetrics and Gynecology

## 2017-11-21 ENCOUNTER — Telehealth: Payer: Self-pay

## 2017-11-21 ENCOUNTER — Ambulatory Visit (INDEPENDENT_AMBULATORY_CARE_PROVIDER_SITE_OTHER): Payer: BLUE CROSS/BLUE SHIELD | Admitting: Obstetrics and Gynecology

## 2017-11-21 VITALS — BP 116/64 | Wt 133.0 lb

## 2017-11-21 DIAGNOSIS — Z9189 Other specified personal risk factors, not elsewhere classified: Secondary | ICD-10-CM | POA: Diagnosis not present

## 2017-11-21 DIAGNOSIS — F329 Major depressive disorder, single episode, unspecified: Secondary | ICD-10-CM | POA: Diagnosis not present

## 2017-11-21 DIAGNOSIS — Z1589 Genetic susceptibility to other disease: Secondary | ICD-10-CM | POA: Diagnosis not present

## 2017-11-21 DIAGNOSIS — F32A Depression, unspecified: Secondary | ICD-10-CM

## 2017-11-21 DIAGNOSIS — N938 Other specified abnormal uterine and vaginal bleeding: Secondary | ICD-10-CM | POA: Diagnosis not present

## 2017-11-21 DIAGNOSIS — F419 Anxiety disorder, unspecified: Secondary | ICD-10-CM | POA: Diagnosis not present

## 2017-11-21 DIAGNOSIS — Z13220 Encounter for screening for lipoid disorders: Secondary | ICD-10-CM

## 2017-11-21 LAB — POCT URINE PREGNANCY: Preg Test, Ur: NEGATIVE

## 2017-11-21 NOTE — Telephone Encounter (Signed)
Pt is scheduled w/[facility] for AE 12/04/17. She is experiencing heavy bleeding w/clots & 7 day menstrual cycle & 3 days later it came back just as heavy if not more heavy w/clots. She has had low iron for a while. She is uncertain why this is happening but it is bothersome to her since she has genetic marker for ovarian cancer. Pt requests apt to be moved up & would like to discuss w/[facility] also. Cb#[phone]  Spoke w/Sara P who has moved pt's apt to 11/28/17 @11am .

## 2017-11-21 NOTE — Telephone Encounter (Signed)
Pt states she is soaking through 1 tampon per hour, wants to have a problem visit before the 1/23 visit. [facility] KJ CMA

## 2017-11-21 NOTE — Telephone Encounter (Signed)
Is pt on BC? Take UPT. F/u at 11/28/17 appt. If sx too bad to wait till that long, needs to come in for problem visit/eval sooner. RN to notify pt.

## 2017-11-21 NOTE — Patient Instructions (Signed)
I value your feedback and entrusting us with your care. If you get a [facility] patient survey, I would appreciate you taking the time to let us know about your experience today. Thank you! 

## 2017-11-21 NOTE — Progress Notes (Signed)
Chief Complaint  Patient presents with  . Menometrorrhagia    HPI:      Katelyn Russo is a 32 y.o. [id number] who LMP was Patient's last menstrual period was 11/10/2017 (exact date)., presents today for DUB sx this month. Menses are usually monthly, last 7 days, med flow, changing tampons Q3 mo with small clots and mild dysmen, no BTB. Pt had normal menses 11/10/17 that lasted 7 days. She then started bleeding again 11/19/17 that was heavier than usual flow and with large clots. No dysmen. Bleeding better today. Pt very concerned since never happened before. She is under increased stress recently.   She is sex active, no new partners. Using condoms usually. Concerned about OCPs due to [place] breast cancer and pt's increased risk of breast cancer.   Pt also concerned about RAD51C mutation. She did OCPs in the past but is concerned again due to [place] breast cancer. She is considering TL. She has not had recent GYN u/s/ca-125.   She also notes some depression/sadness sx. She had them PP and they haven't resolved. She tried xanax once which caused severe fatigue. She also tried zoloft which caused OCD tendencies, although helped with sx.  She is due for annual and has sched in a couple wks.    Past Medical History:  Diagnosis Date  . Family history of breast cancer   . 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 Increased risk of breast cancer 2016   IBIS=30% due to [place], not RAD51 C mutation    Past Surgical History:  Procedure Laterality Date  . ORIF ANKLE FRACTURE Left 2010  . ROTATOR CUFF REPAIR Right 2004    Family History  Problem Relation Age of Onset  . Breast cancer Mother 75  . Breast cancer Maternal Grandmother 65       again at 34     Social History   Socioeconomic History  . Marital status: Unknown    Spouse name: Not on file  . Number of children: Not on file  . Years of education: Not on file  . Highest education  level: Not on file  Social Needs  . Financial resource strain: Not on file  . Food insecurity - worry: Not on file  . Food insecurity - inability: Not on file  . Transportation needs - medical: Not on file  . Transportation needs - non-medical: Not on file  Occupational History  . Not on file  Tobacco Use  . Smoking status: Never Smoker  . Smokeless tobacco: Never Used  Substance and Sexual Activity  . Alcohol use: No  . Drug use: No  . Sexual activity: Yes  Other Topics Concern  . Not on file  Social History Narrative  . Not on file    No current outpatient medications on file.   ROS:  Review of Systems  Constitutional: Negative for fever.  Gastrointestinal: Negative for blood in stool, constipation, diarrhea, nausea and vomiting.  Genitourinary: Positive for menstrual problem. Negative for dyspareunia, dysuria, flank pain, frequency, hematuria, urgency, vaginal bleeding, vaginal discharge and vaginal pain.  Musculoskeletal: Negative for back pain.  Skin: Negative for rash.  Psychiatric/Behavioral: Positive for agitation and dysphoric mood. Negative for behavioral problems and self-injury.     OBJECTIVE:   Vitals:  BP 116/64 (BP Location: Left Arm, Patient Position: Sitting, Cuff Size: Normal)   Wt 133 lb (60.3 kg)   LMP 11/10/2017 (Exact Date)  Breastfeeding? No   BMI 22.83 kg/m   Physical Exam  Constitutional: She is oriented to person, place, and time and well-developed, well-nourished, and in no distress. Vital signs are normal.  Genitourinary: Vagina normal, uterus normal, cervix normal, right adnexa normal, left adnexa normal and vulva normal. Uterus is not enlarged. Cervix exhibits no motion tenderness and no tenderness. Right adnexum displays no mass and no tenderness. Left adnexum displays no mass and no tenderness. Vulva exhibits no erythema, no exudate, no lesion, no rash and no tenderness. Vagina exhibits no lesion.  Musculoskeletal: Normal range of  motion.  Neurological: She is oriented to person, place, and time.  Psychiatric: Memory, affect and judgment normal.  Vitals reviewed.   Results: Results for orders placed or performed in visit on 11/21/17 (from the past 24 hour(s))  POCT urine pregnancy     Status: Normal   Collection Time: 11/21/17  1:47 PM  Result Value Ref Range   Preg Test, Ur Negative Negative     Assessment/Plan: DUB (dysfunctional uterine bleeding) - Neg UPT. Decrease flow today. Check labs/u/s. If neg, could be stress-related and may resolve. Will discuss OCPs at f/u for cycle control if neg.  - Plan: POCT urine pregnancy, TSH + free T4, Prolactin, US PELVIS TRANSVANGINAL NON-OB (TV ONLY)  Monoallelic mutation of LHT34K gene - CHeck u/s and ca-125. Discussed bilat salpingectomy instead of TL now and then bilat oophorectomy in future vs BSO now. Guidelines suggest BSO by age 76/50. - Plan: CA 125  Anxiety and depression - Will discuss more at annual. May be able to get some relief with OCPs. Also can try different SSRI from zoloft.   Screening cholesterol level - Pt request. - Plan: Lipid panel  Increased risk of breast cancer - Will do CBE at annual and sched mammo.    Return in about 3 days (around 11/24/2017) for GYN u/s for DUB, annual appt with [facility] after.  Landen Knoedler B. Jhan Conery, PA-C 11/21/2017 2:55 PM

## 2017-11-22 LAB — LIPID PANEL
CHOLESTEROL TOTAL: 133 mg/dL (ref 100–199)
Chol/HDL Ratio: 2.8 ratio (ref 0.0–4.4)
HDL: 48 mg/dL (ref >39)
LDL CALC: 66 mg/dL (ref 0–99)
TRIGLYCERIDES: 94 mg/dL (ref 0–149)
VLDL CHOLESTEROL CAL: 19 mg/dL (ref 5–40)

## 2017-11-22 LAB — CA 125: Cancer Antigen (CA) 125: 9.2 U/mL (ref 0.0–38.1)

## 2017-11-22 LAB — TSH+FREE T4
Free T4: 1.26 ng/dL (ref 0.82–1.77)
TSH: 1.18 u[IU]/mL (ref 0.450–4.500)

## 2017-11-22 LAB — PROLACTIN: PROLACTIN: 13.6 ng/mL (ref 4.8–23.3)

## 2017-11-28 ENCOUNTER — Ambulatory Visit (INDEPENDENT_AMBULATORY_CARE_PROVIDER_SITE_OTHER): Payer: BLUE CROSS/BLUE SHIELD

## 2017-11-28 ENCOUNTER — Encounter: Payer: Self-pay | Admitting: Obstetrics and Gynecology

## 2017-11-28 ENCOUNTER — Ambulatory Visit (INDEPENDENT_AMBULATORY_CARE_PROVIDER_SITE_OTHER): Payer: BLUE CROSS/BLUE SHIELD | Admitting: Obstetrics and Gynecology

## 2017-11-28 VITALS — BP 110/60 | Wt 134.0 lb

## 2017-11-28 DIAGNOSIS — Z1231 Encounter for screening mammogram for malignant neoplasm of breast: Secondary | ICD-10-CM

## 2017-11-28 DIAGNOSIS — Z124 Encounter for screening for malignant neoplasm of cervix: Secondary | ICD-10-CM | POA: Diagnosis not present

## 2017-11-28 DIAGNOSIS — Z1239 Encounter for other screening for malignant neoplasm of breast: Secondary | ICD-10-CM

## 2017-11-28 DIAGNOSIS — Z01419 Encounter for gynecological examination (general) (routine) without abnormal findings: Secondary | ICD-10-CM

## 2017-11-28 DIAGNOSIS — Z30011 Encounter for initial prescription of contraceptive pills: Secondary | ICD-10-CM

## 2017-11-28 DIAGNOSIS — Z1151 Encounter for screening for human papillomavirus (HPV): Secondary | ICD-10-CM | POA: Diagnosis not present

## 2017-11-28 DIAGNOSIS — Z803 Family history of malignant neoplasm of breast: Secondary | ICD-10-CM | POA: Diagnosis not present

## 2017-11-28 DIAGNOSIS — Z1589 Genetic susceptibility to other disease: Secondary | ICD-10-CM

## 2017-11-28 DIAGNOSIS — Z9189 Other specified personal risk factors, not elsewhere classified: Secondary | ICD-10-CM

## 2017-11-28 DIAGNOSIS — N938 Other specified abnormal uterine and vaginal bleeding: Secondary | ICD-10-CM | POA: Diagnosis not present

## 2017-11-28 MED ORDER — NORETHIN-ETH ESTRAD-FE BIPHAS 1 MG-10 MCG / 10 MCG PO TABS
1.0000 | ORAL_TABLET | Freq: Every day | ORAL | 3 refills | Status: AC
Start: 2017-11-28 — End: ?

## 2017-11-28 NOTE — Patient Instructions (Signed)
I value your feedback and entrusting us with your care. If you get a [facility] patient survey, I would appreciate you taking the time to let us know about your experience today. Thank you! 

## 2017-11-28 NOTE — Progress Notes (Signed)
PCP:  Patient, No Pcp Per   Chief Complaint  Patient presents with  . Gynecologic Exam  . U/S follow up     HPI:      Ms. Katelyn Russo is a 32 y.o. [id number] who LMP was Patient's last menstrual period was 11/10/2017 (exact date)., presents today for her annual examination.  Her menses are regular every 28-30 days, lasting 7 days.  Dysmenorrhea mild, occurring first 1-2 days of flow. She usually does not have intermenstrual bleeding, but did this cycle. She was seen for this 11/21/17 and had neg labs and GYN u/s today. Pt states bleeding resolved since I last saw her.   Sex activity: single partner, contraception - condoms. Would like to start OCPs. Never been on them in the past due to [place] breast cancer, pt's increased risk of breast cancer. Pros/cons discussed and pt still candidate for OCPs despite hx. She also has RAD51C mutation which is increased risk of ovarian cancer. BSO recommended by age 43/50. We discussed OCPs vs salpingectomy now vs BSO now or later 11/21/17. Pt has decided to try OCPs. Had neg ca-125/and GYN u/s today.    Last Pap: not recent Hx of STDs: none  Last mammogram: January 08, 2017  Results were: normal--routine follow-up in 12 months There is a FH of breast cancer in her mom and MGM. There is no FH of ovarian cancer. Pt tested RAD51C pos (ovarian cancer later in life) but has increased risk of breast cancer based on FH, IBIS=30%. The patient does do self-breast exams. Not taking Vit D, hasn't had breast MRI.  Tobacco use: The patient denies current or previous tobacco use. Alcohol use: none No drug use.  Exercise: very active  She does get adequate calcium but not Vitamin D in her diet.  Past Medical History:  Diagnosis Date  . Family history of breast cancer   . 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 Increased risk of breast cancer 2016   IBIS=30% due to [place], not RAD51 C mutation    Past Surgical  History:  Procedure Laterality Date  . ORIF ANKLE FRACTURE Left 2010  . ROTATOR CUFF REPAIR Right 2004    Family History  Problem Relation Age of Onset  . Breast cancer Mother 28  . Breast cancer Maternal Grandmother 65       again at 32     Social History   Socioeconomic History  . Marital status: Unknown    Spouse name: Not on file  . Number of children: Not on file  . Years of education: Not on file  . Highest education level: Not on file  Social Needs  . Financial resource strain: Not on file  . Food insecurity - worry: Not on file  . Food insecurity - inability: Not on file  . Transportation needs - medical: Not on file  . Transportation needs - non-medical: Not on file  Occupational History  . Not on file  Tobacco Use  . Smoking status: Never Smoker  . Smokeless tobacco: Never Used  Substance and Sexual Activity  . Alcohol use: No  . Drug use: No  . Sexual activity: Yes  Other Topics Concern  . Not on file  Social History Narrative  . Not on file    No outpatient medications have been marked as taking for the 11/28/17 encounter (Office Visit) with Copland, Elmo Putt B, PA-C.     ROS:  Review of Systems  Constitutional: Negative for fatigue, fever and unexpected weight change.  Respiratory: Negative for cough, shortness of breath and wheezing.   Cardiovascular: Negative for chest pain, palpitations and leg swelling.  Gastrointestinal: Negative for blood in stool, constipation, diarrhea, nausea and vomiting.  Endocrine: Negative for cold intolerance, heat intolerance and polyuria.  Genitourinary: Negative for dyspareunia, dysuria, flank pain, frequency, genital sores, hematuria, menstrual problem, pelvic pain, urgency, vaginal bleeding, vaginal discharge and vaginal pain.  Musculoskeletal: Negative for back pain, joint swelling and myalgias.  Skin: Negative for rash.  Neurological: Negative for dizziness, syncope, light-headedness, numbness and headaches.    Hematological: Negative for adenopathy.  Psychiatric/Behavioral: Negative for agitation, confusion, sleep disturbance and suicidal ideas. The patient is not nervous/anxious.      Objective: BP 110/60 (BP Location: Left Arm, Patient Position: Sitting, Cuff Size: Normal)   Wt 134 lb (60.8 kg)   LMP 11/10/2017 (Exact Date)   BMI 23.00 kg/m    Physical Exam  Constitutional: She is oriented to person, place, and time. She appears well-developed and well-nourished.  Genitourinary: Vagina normal and uterus normal. There is no rash or tenderness on the right labia. There is no rash or tenderness on the left labia. No erythema or tenderness in the vagina. No vaginal discharge found. Right adnexum does not display mass and does not display tenderness. Left adnexum does not display mass and does not display tenderness. Cervix does not exhibit motion tenderness or polyp. Uterus is not enlarged or tender.  Neck: Normal range of motion. No thyromegaly present.  Cardiovascular: Normal rate, regular rhythm and normal heart sounds.  No murmur heard. Pulmonary/Chest: Effort normal and breath sounds normal. Right breast exhibits no mass, no nipple discharge, no skin change and no tenderness. Left breast exhibits no mass, no nipple discharge, no skin change and no tenderness.  Abdominal: Soft. There is no tenderness. There is no guarding.  Musculoskeletal: Normal range of motion.  Neurological: She is alert and oriented to person, place, and time. No cranial nerve deficit.  Psychiatric: She has a normal mood and affect. Her behavior is normal.  Vitals reviewed.   Results: GYN U/S-->    Assessment/Plan: Encounter for annual routine gynecological examination  Cervical cancer screening - Plan: IGP, Aptima HPV  Screening for HPV (human papillomavirus) - Plan: IGP, Aptima HPV  Screening for breast cancer - Pt to sched 3D mammo 3/19.  - Plan: MM SCREENING BREAST TOMO BILATERAL  Family history of  breast cancer - Plan: MM SCREENING BREAST TOMO BILATERAL  Increased risk of breast cancer - IBIS=30%. Cont monthly SBE, Q6-12 mo CBE, yearly mammo and scr br MRI. Pt to call for MRI ref prn. Add VIt D3 2000 IU daily.  - Plan: MM SCREENING BREAST TOMO BILATERAL  Encounter for initial prescription of contraceptive pills - OCP start with next menses. Condoms. Rx Lo Loestrin, 1 sample, coupon card - Plan: Norethindrone-Ethinyl Estradiol-Fe Biphas (LO LOESTRIN FE) 1 MG-10 MCG / 10 MCG tablet  DUB (dysfunctional uterine bleeding) - Sx resolved. Neg u/s and labs. OCP start anyway. F/u prn.   Monoallelic mutation of CXK48J gene - Increased risk of ovar cancer later in life. BSO recommended by age 63/50. Pt wants to do OCPs for now for BC/ovar protection. May want BSO in future. Pt aware they're aren't great ovar ca screening options but can have yearly GYN u/s and ca-125. Neg this yr.   Meds ordered this encounter  Medications  . Norethindrone-Ethinyl Estradiol-Fe Biphas (LO LOESTRIN  FE) 1 MG-10 MCG / 10 MCG tablet    Sig: Take 1 tablet by mouth daily.    Dispense:  84 tablet    Refill:  3    Order Specific Question:   Supervising Provider    Answer:   Gae Dry [[id number]]             GYN counsel breast self exam, mammography screening, use and side effects of OCP's, adequate intake of calcium and vitamin D, diet and exercise     F/U  Return in about 1 year (around 11/28/2018).  Alicia B. Copland, PA-C 11/28/2017 3:07 PM

## 2017-11-30 LAB — IGP, APTIMA HPV
HPV APTIMA: NEGATIVE
PAP Smear Comment: 0

## 2017-12-04 ENCOUNTER — Ambulatory Visit: Payer: Self-pay | Admitting: Obstetrics and Gynecology

## 2017-12-10 ENCOUNTER — Ambulatory Visit: Payer: Self-pay | Admitting: Obstetrics and Gynecology

## 2017-12-11 ENCOUNTER — Encounter: Payer: Self-pay | Admitting: Obstetrics and Gynecology

## 2018-01-15 ENCOUNTER — Ambulatory Visit: Payer: Self-pay | Admitting: Internal Medicine

## 2018-01-31 ENCOUNTER — Emergency Department
Admission: EM | Admit: 2018-01-31 | Discharge: 2018-01-31 | Disposition: A | Discharge status: Home / Self Care | Payer: BLUE CROSS/BLUE SHIELD | Attending: Emergency Medicine | Admitting: Emergency Medicine

## 2018-01-31 ENCOUNTER — Other Ambulatory Visit: Payer: Self-pay

## 2018-01-31 ENCOUNTER — Emergency Department: Payer: BLUE CROSS/BLUE SHIELD

## 2018-01-31 ENCOUNTER — Encounter: Payer: Self-pay | Admitting: Emergency Medicine

## 2018-01-31 DIAGNOSIS — H532 Diplopia: Secondary | ICD-10-CM | POA: Insufficient documentation

## 2018-01-31 DIAGNOSIS — H538 Other visual disturbances: Secondary | ICD-10-CM | POA: Diagnosis present

## 2018-01-31 DIAGNOSIS — N390 Urinary tract infection, site not specified: Secondary | ICD-10-CM | POA: Insufficient documentation

## 2018-01-31 DIAGNOSIS — R531 Weakness: Secondary | ICD-10-CM

## 2018-01-31 LAB — URINALYSIS, ROUTINE W REFLEX MICROSCOPIC
Bilirubin Urine: NEGATIVE
GLUCOSE, UA: NEGATIVE mg/dL
HGB URINE DIPSTICK: NEGATIVE
Ketones, ur: NEGATIVE mg/dL
Nitrite: NEGATIVE
PH: 5 (ref 5.0–8.0)
PROTEIN: NEGATIVE mg/dL
SPECIFIC GRAVITY, URINE: 1.017 (ref 1.005–1.030)

## 2018-01-31 LAB — COMPREHENSIVE METABOLIC PANEL
ALBUMIN: 4.3 g/dL (ref 3.5–5.0)
ALT: 12 U/L — ABNORMAL LOW (ref 14–54)
AST: 19 U/L (ref 15–41)
Alkaline Phosphatase: 34 U/L — ABNORMAL LOW (ref 38–126)
Anion gap: 7 (ref 5–15)
BILIRUBIN TOTAL: 0.6 mg/dL (ref 0.3–1.2)
BUN: 13 mg/dL (ref 6–20)
CO2: 22 mmol/L (ref 22–32)
Calcium: 9 mg/dL (ref 8.9–10.3)
Chloride: 108 mmol/L (ref 101–111)
Creatinine, Ser: 0.53 mg/dL (ref 0.44–1.00)
GFR calc Af Amer: >60 mL/min (ref >60)
GFR calc non Af Amer: >60 mL/min (ref >60)
GLUCOSE: 99 mg/dL (ref 65–99)
POTASSIUM: 4.1 mmol/L (ref 3.5–5.1)
SODIUM: 137 mmol/L (ref 135–145)
TOTAL PROTEIN: 7.1 g/dL (ref 6.5–8.1)

## 2018-01-31 LAB — CBC
HEMATOCRIT: 40 % (ref 35.0–47.0)
Hemoglobin: 13.4 g/dL (ref 12.0–16.0)
MCH: 30.1 pg (ref 26.0–34.0)
MCHC: 33.5 g/dL (ref 32.0–36.0)
MCV: 89.8 fL (ref 80.0–100.0)
Platelets: 340 10*3/uL (ref 150–440)
RBC: 4.46 MIL/uL (ref 3.80–5.20)
RDW: 13 % (ref 11.5–14.5)
WBC: 4.4 10*3/uL (ref 3.6–11.0)

## 2018-01-31 LAB — TROPONIN I: Troponin I: <0.03 ng/mL (ref <0.03)

## 2018-01-31 LAB — POCT PREGNANCY, URINE: Preg Test, Ur: NEGATIVE

## 2018-01-31 MED ORDER — CEPHALEXIN 500 MG PO CAPS: 500.0000 mg | ORAL_CAPSULE | Freq: Three times a day (TID) | ORAL | 0 refills | Status: AC

## 2018-01-31 NOTE — ED Triage Notes (Signed)
Pt sent from eye doctor for TIA like symptoms since Monday.  Has had numbness/tingling to both hands.  Also c/o blurry vision in right eye only that has been constant.  Feels off balance when walking. Has had "dizzy" spells.  Denies pain at this time.  C/o right eye drooping at times but this seems better now; gets worse throughout day.

## 2018-01-31 NOTE — Discharge Instructions (Signed)
Please have your primary care doctor follow-up on your ACh-R (myasthenia gravis) test.  Your workup today has shown normal results in the emergency department besides a urinary tract infection.  Please take your antibiotics as prescribed for their entire course.  Return to the emergency department for any worsening symptoms.

## 2018-01-31 NOTE — ED Notes (Signed)
Pt going to MRI

## 2018-01-31 NOTE — ED Provider Notes (Signed)
[facility] ([place]) Emergency Department Provider Note  Time seen: 10:21 AM  I have reviewed the triage vital signs and the nursing notes.   HISTORY  Chief Complaint Numbness and vision changes    HPI Katelyn Russo is a 32 y.o. female with no significant past medical history who presents to the emergency department with blurred vision, right-sided facial droop and left arm weakness.  According to the patient over the past 2-3 days she has noticed intermittent blurred vision which she describes more as a doubling of her vision.  States this will last minutes to hours and then go away but today has been much more frequent.  She also states over the past 2 days she has been feeling occasional tingling in her fingertips on both sides and has noted her left arm to be weak including today.  She states symptoms appear to be worse in the evening versus the morning.  She talked to her eye doctor today who referred her to the emergency department for stroke workup given her symptoms.  Patient denies any recent illnesses, cough or congestion, fever, headache or confusion.  States she has had mild off balance sensation over the past 2 days as well.   Past Medical History:  Diagnosis Date  . Family history of breast cancer   . 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 Increased risk of breast cancer 2016   IBIS=30% due to [place], not RAD51 C mutation    There are no active problems to display for this patient.   Past Surgical History:  Procedure Laterality Date  . ORIF ANKLE FRACTURE Left 2010  . ROTATOR CUFF REPAIR Right 2004    Prior to Admission medications   Medication Sig Start Date End Date Taking? Authorizing Provider  Multiple Vitamins-Minerals (MULTIVITAMIN WITH MINERALS) tablet Take 1 tablet by mouth daily.   Yes [provider]  Norethindrone-Ethinyl Estradiol-Fe Biphas (LO LOESTRIN FE) 1 MG-10 MCG / 10  MCG tablet Take 1 tablet by mouth daily. Patient not taking: Reported on 01/31/2018 9/73/53   Copland, Deirdre Evener, PA-C    Allergies  Allergen Reactions  . Morphine And Related Other (See Comments)    "felt like my body was on fire"    Family History  Problem Relation Age of Onset  . Breast cancer Mother 73  . Breast cancer Maternal Grandmother 65       again at 39     Social History Social History   Tobacco Use  . Smoking status: Never Smoker  . Smokeless tobacco: Never Used  Substance Use Topics  . Alcohol use: No  . Drug use: No    Review of Systems Constitutional: Negative for fever. Eyes: States double vision at times. ENT: Negative for recent illness/congestion Cardiovascular: Negative for chest pain. Respiratory: Negative for shortness of breath. Gastrointestinal: Negative for abdominal pain, vomiting and diarrhea. Genitourinary: Negative for urinary compaints Musculoskeletal: Negative for musculoskeletal complaints Skin: Negative for skin complaints  Neurological: Denies headache.  States left arm weakness over the past 1-2 days, intermittent double vision, intermittent drooping of her right face. All other ROS negative  ____________________________________________   PHYSICAL EXAM:  VITAL SIGNS: ED Triage Vitals  Enc Vitals Group     BP 01/31/18 0845 (!) 120/92     Pulse Rate 01/31/18 0845 78     Resp 01/31/18 0845 (!) 21     Temp --  Temp src --      SpO2 01/31/18 0845 100 %     Weight 01/31/18 0831 135 lb (61.2 kg)     Height 01/31/18 0831 _0  (1.6 m)     Head Circumference --      Peak Flow --      Pain Score 01/31/18 0831 0     Pain Loc --      Pain Edu? --      Excl. in [place]? --    Constitutional: Alert and oriented. Well appearing and in no distress. Eyes: Normal exam, [place], EOMI ENT   Head: Normocephalic and atraumatic.   Nose: No congestion/rhinnorhea.    Mouth/Throat: Mucous membranes are moist. Cardiovascular: Normal  rate, regular rhythm. No murmur Respiratory: Normal respiratory effort without tachypnea nor retractions. Breath sounds are clear  Gastrointestinal: Soft and nontender. No distention.  Musculoskeletal: Nontender with normal range of motion in all extremities. No lower extremity tenderness or edema. Neurologic:  Normal speech and language.  Cranial nerves intact, extraocular muscles intact, no droop or ptosis.  Patient does have 4/5 motor in the left upper extremity compared to 5/5 motor in all other extremities.  Slight left pronator drift as well.  No lower extremity drift. Skin:  Skin is warm, dry and intact.  Psychiatric: Mood and affect are normal. Speech and behavior are normal.   ____________________________________________    EKG  EKG reviewed and interpreted by myself shows normal sinus rhythm at 79 bpm with a narrow QRS, normal axis, normal intervals, no concerning ST changes.  ____________________________________________    RADIOLOGY  MRI negative  ____________________________________________   INITIAL IMPRESSION / ASSESSMENT AND PLAN / ED COURSE  Pertinent labs & imaging results that were available during my care of the patient were reviewed by me and considered in my medical decision making (see chart for details).  Patient presents to the emergency department for intermittent neurologic symptoms over the past 2 days.  On exam patient does have decreased strength with a pronator drift in the left upper extremity.  She states intermittently she will have drooping of her right eyelid and right facial droop but none currently.  States symptoms have been noted to be somewhat worse in the evening versus the morning.  No history of early stroke in the family, no history of myasthenia gravis in the family or multiple sclerosis.  Differential this time would include myasthenia, CVA, MS, TIA, peripheral neuropathy.  Given the patient's vague and intermittent neurologic symptoms we  will proceed with an MRI to further evaluate.  Patient's labs are reassuring including negative troponin.  EKG is reassuring.  Patient symptoms described as worse in the evening would raise concern for possible myasthenia gravis.  I have sent an AChR test as well.  We will continue to closely monitor while awaiting MRI results.  Patient agreeable to this plan of care.  MRI of the brain is normal.  No acute abnormality.  Labs are within normal limits besides a urinary tract infection.  We will discharge on antibiotics for the urinary tract infection.  The AChR test is pending however I discussed this with lab it will take 2-3 days to result.  I discussed this with the patient who will have her primary care doctor follow-up on this test.  Patient agreeable to this plan of care.  ____________________________________________   FINAL CLINICAL IMPRESSION(S) / ED DIAGNOSES  Double vision Left upper extremity weakness Urinary tract infection   Harvest Dark, MD 01/31/18 1120

## 2018-01-31 NOTE — ED Notes (Signed)
Patient transported to MRI 

## 2018-01-31 NOTE — ED Notes (Signed)
Pt discharged to home.  Family member driving.  Discharge instructions reviewed.  Verbalized understanding.  No questions or concerns at this time.  Teach back verified.  Pt in NAD.  No items left in ED.   

## 2018-02-01 LAB — URINE CULTURE: Culture: <10000 — AB

## 2018-02-01 LAB — ACETYLCHOLINE RECEPTOR, BINDING: Acety choline binding ab: <0.03 nmol/L (ref 0.00–0.24)

## 2018-02-05 ENCOUNTER — Encounter: Payer: Self-pay | Admitting: Obstetrics and Gynecology

## 2018-03-26 ENCOUNTER — Ambulatory Visit
Admission: RE | Admit: 2018-03-26 | Discharge: 2018-03-26 | Disposition: A | Discharge status: Home / Self Care | Payer: BLUE CROSS/BLUE SHIELD | Source: Ambulatory Visit | Attending: Obstetrics and Gynecology | Admitting: Obstetrics and Gynecology

## 2018-03-26 DIAGNOSIS — Z9189 Other specified personal risk factors, not elsewhere classified: Secondary | ICD-10-CM | POA: Diagnosis present

## 2018-03-26 DIAGNOSIS — Z1231 Encounter for screening mammogram for malignant neoplasm of breast: Secondary | ICD-10-CM | POA: Insufficient documentation

## 2018-03-26 DIAGNOSIS — Z803 Family history of malignant neoplasm of breast: Secondary | ICD-10-CM | POA: Insufficient documentation

## 2018-03-26 DIAGNOSIS — Z1239 Encounter for other screening for malignant neoplasm of breast: Secondary | ICD-10-CM

## 2018-03-27 ENCOUNTER — Encounter: Payer: Self-pay | Admitting: Obstetrics and Gynecology

## 2018-03-28 ENCOUNTER — Encounter: Payer: Self-pay | Admitting: Obstetrics and Gynecology

## 2018-04-17 ENCOUNTER — Encounter: Payer: Self-pay | Admitting: Obstetrics and Gynecology

## 2018-10-27 IMAGING — MG MM SCREENING BREAST TOMO BILATERAL
4 series · 4 of 12 positions shown · non-contrast
Comparison: Previous exam(s).

CLINICAL DATA: Screening.

EXAM:
DIGITAL SCREENING BILATERAL MAMMOGRAM WITH TOMO AND CAD

[R MLO synth-2D]
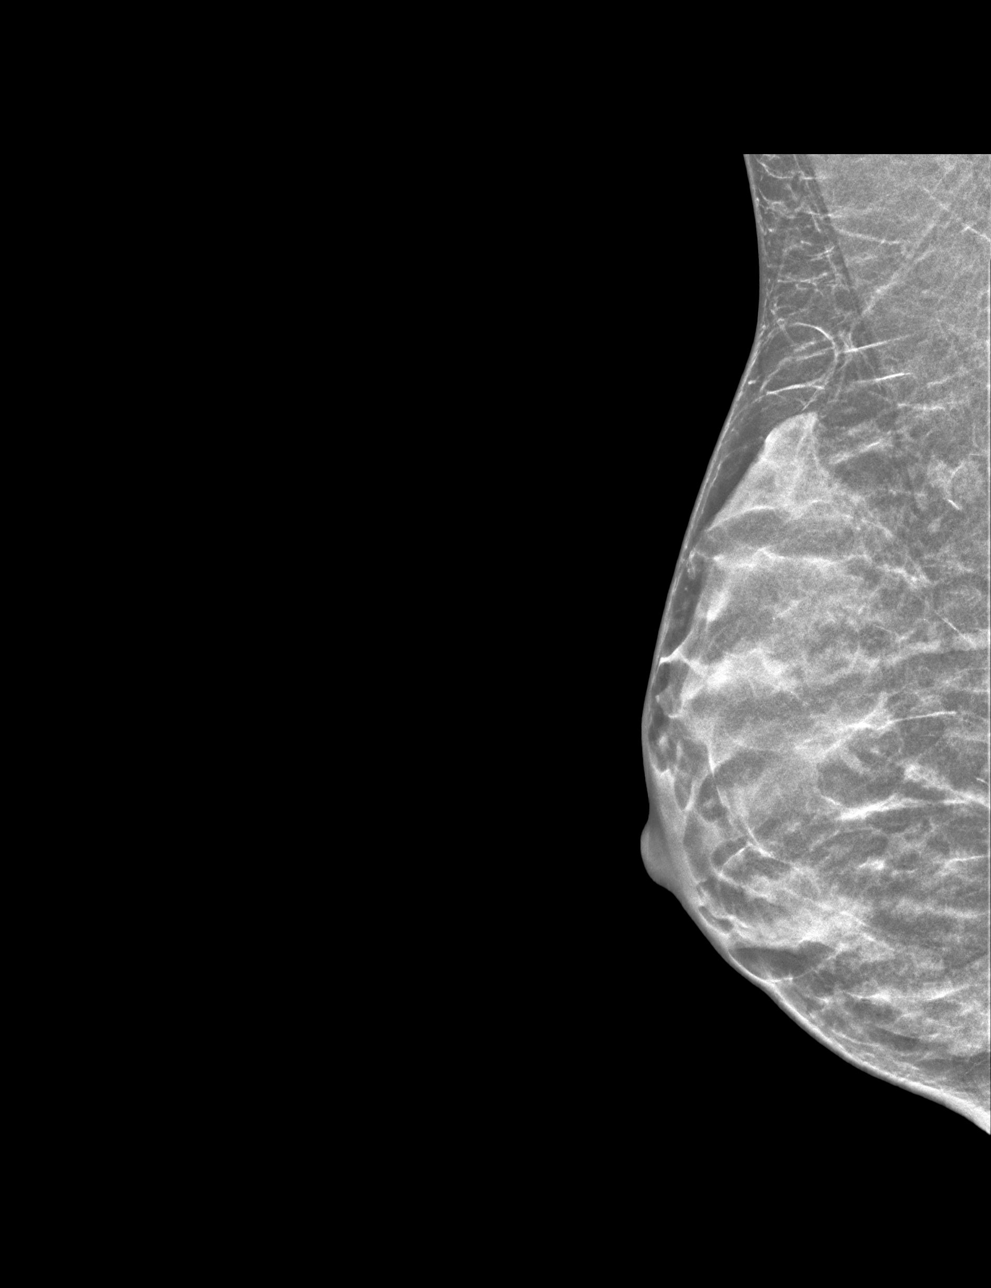

[L MLO synth-2D]
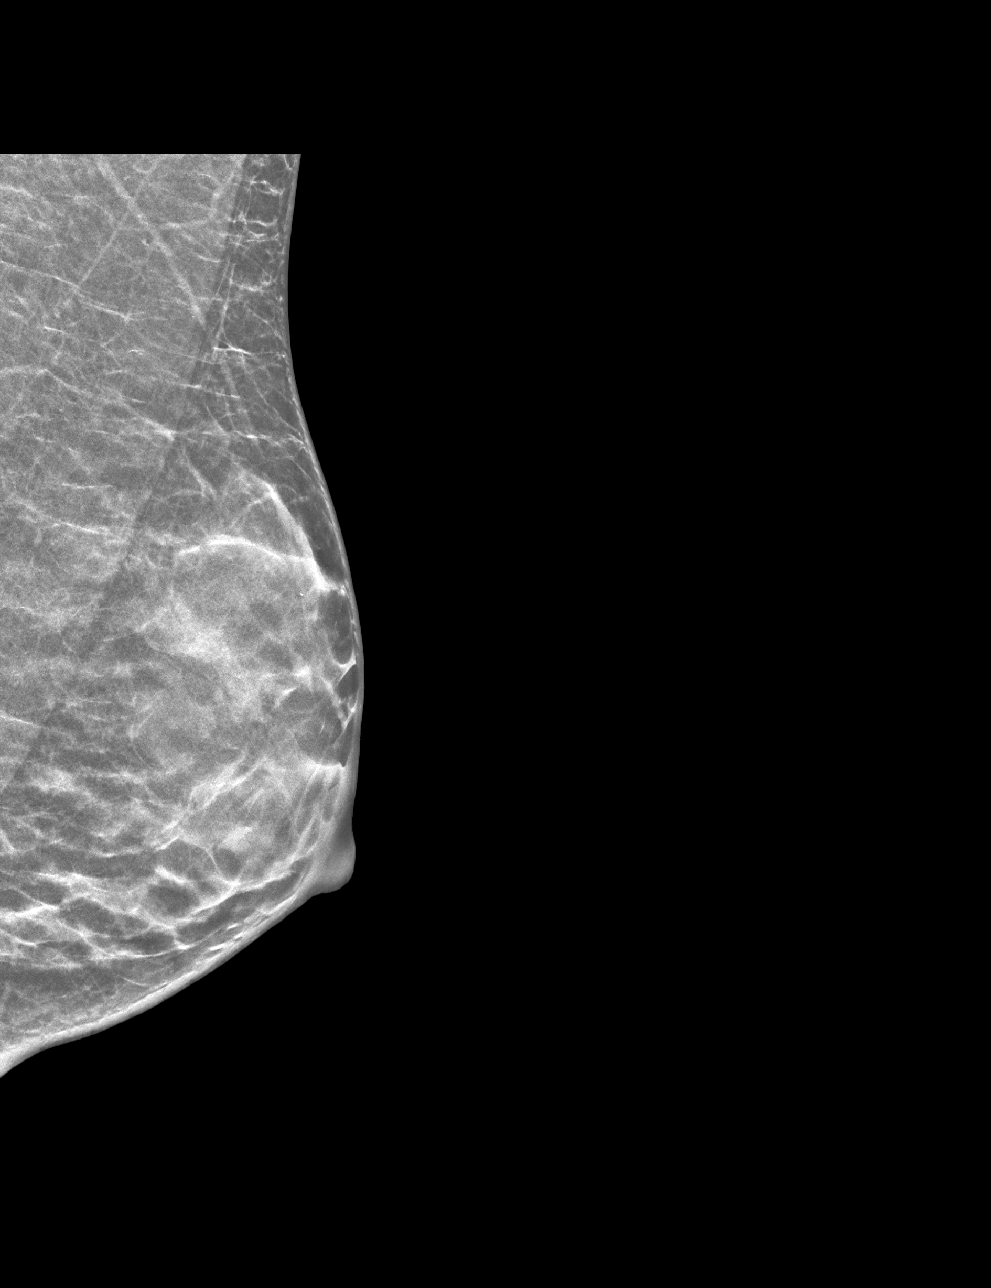

[L MLO tomo · tomo slice 19/36.0]
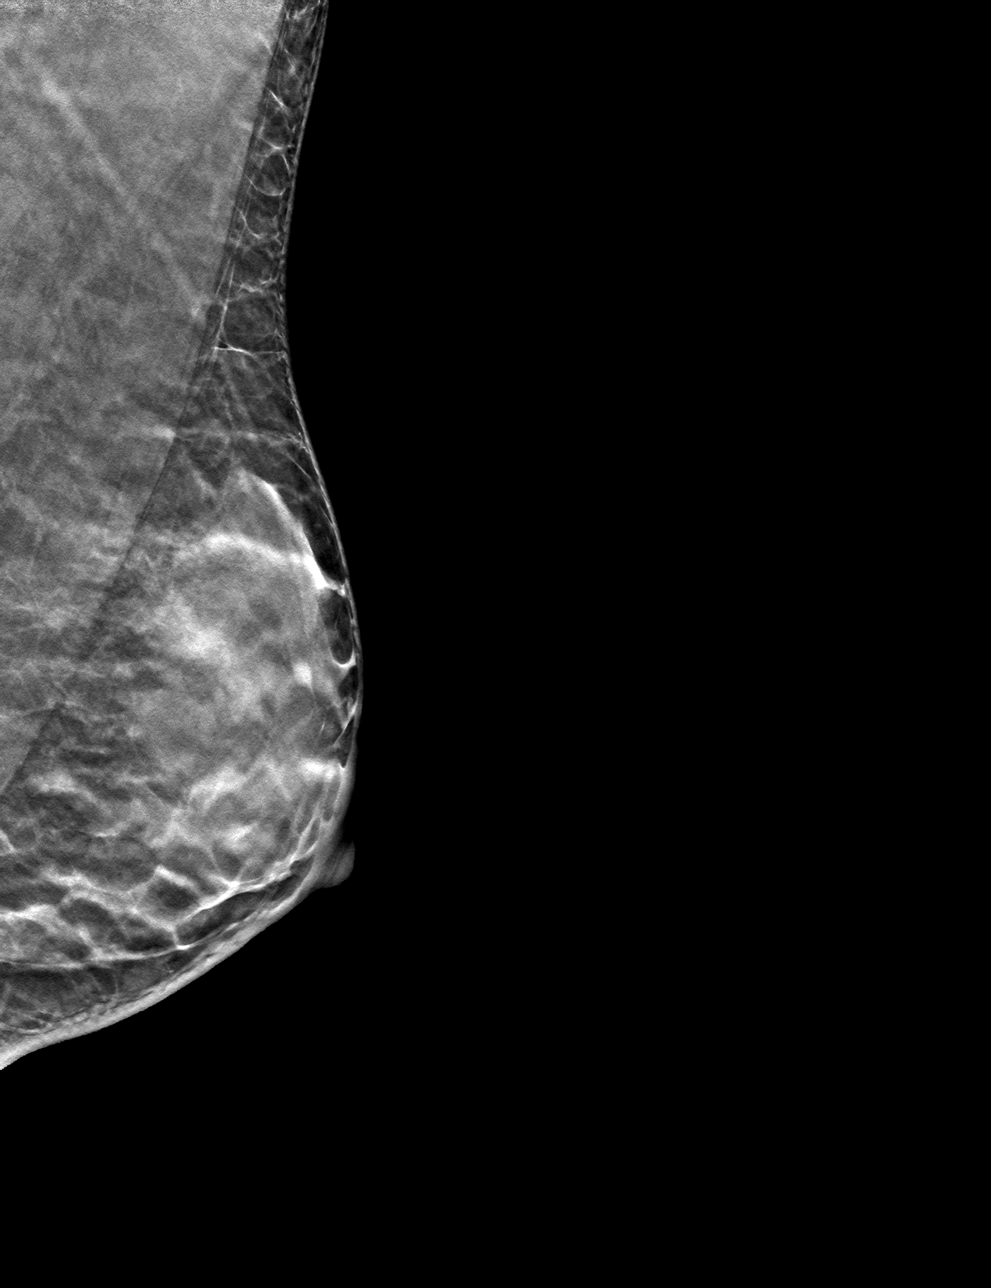

[R MLO tomo · tomo slice 19/37.0]
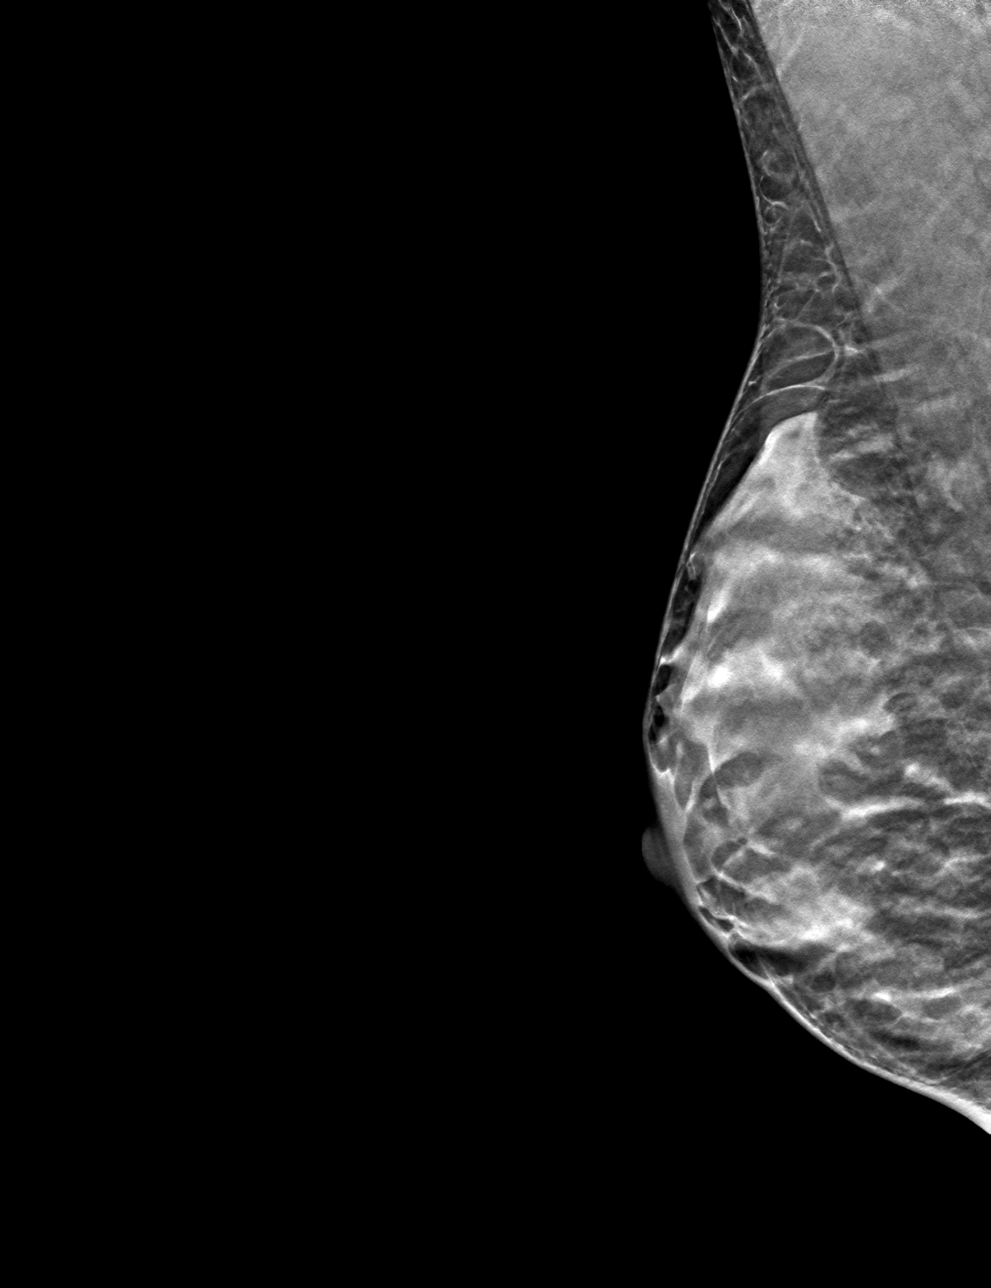

[4 of 12 positions shown; findings below may reference images not displayed]

ACR Breast Density Category d: The breast tissue is extremely dense,
which lowers the sensitivity of mammography.
FINDINGS: There are no findings suspicious for malignancy. Images were
processed with CAD.
IMPRESSION: No mammographic evidence of malignancy. A result letter of this
screening mammogram will be mailed directly to the patient.

RECOMMENDATION:
1.  Screening mammogram in one year.(Code:[id number])

2. Consider genetics assessment for this patient to determine her
lifetime risk of breast cancer given her strong family history of
breast cancer. Per [employer] guidelines, if the
patient has a calculated lifetime risk of developing breast cancer
of greater than 20%, annual screening MRI of the breasts would be
recommended at the time of screening mammography.

BI-RADS CATEGORY  1: Negative.
